# Patient Record
Sex: Female | Born: 1955 | Race: White | Hispanic: No | Smoking: Never smoker
Health system: Southern US, Community
[De-identification: ages and names within clinical notes are randomized; demographics above are authoritative.]

## PROBLEM LIST (undated history)

## (undated) DIAGNOSIS — E039 Hypothyroidism, unspecified: Secondary | ICD-10-CM

## (undated) DIAGNOSIS — E785 Hyperlipidemia, unspecified: Secondary | ICD-10-CM

## (undated) DIAGNOSIS — I10 Essential (primary) hypertension: Secondary | ICD-10-CM

## (undated) DIAGNOSIS — L409 Psoriasis, unspecified: Secondary | ICD-10-CM

## (undated) HISTORY — PX: ABDOMINAL HYSTERECTOMY: SHX81

## (undated) HISTORY — PX: TUBAL LIGATION: SHX77

---

## 2008-02-16 ENCOUNTER — Emergency Department: Payer: Self-pay | Admitting: Emergency Medicine

## 2008-03-12 ENCOUNTER — Ambulatory Visit: Payer: Self-pay

## 2015-04-06 ENCOUNTER — Other Ambulatory Visit: Payer: Self-pay | Admitting: Family Medicine

## 2015-04-06 DIAGNOSIS — Z1231 Encounter for screening mammogram for malignant neoplasm of breast: Secondary | ICD-10-CM

## 2015-04-08 ENCOUNTER — Ambulatory Visit
Admission: RE | Admit: 2015-04-08 | Discharge: 2015-04-08 | Disposition: A | Discharge status: Home / Self Care | Payer: BLUE CROSS/BLUE SHIELD | Source: Ambulatory Visit | Attending: Family Medicine | Admitting: Family Medicine

## 2015-04-08 DIAGNOSIS — Z1231 Encounter for screening mammogram for malignant neoplasm of breast: Secondary | ICD-10-CM | POA: Diagnosis present

## 2015-04-08 DIAGNOSIS — R922 Inconclusive mammogram: Secondary | ICD-10-CM | POA: Diagnosis not present

## 2015-04-10 ENCOUNTER — Other Ambulatory Visit: Payer: Self-pay | Admitting: Family Medicine

## 2015-04-10 DIAGNOSIS — R921 Mammographic calcification found on diagnostic imaging of breast: Secondary | ICD-10-CM

## 2015-04-10 DIAGNOSIS — R928 Other abnormal and inconclusive findings on diagnostic imaging of breast: Secondary | ICD-10-CM

## 2015-04-17 ENCOUNTER — Other Ambulatory Visit: Payer: Self-pay | Admitting: Family Medicine

## 2015-04-17 ENCOUNTER — Ambulatory Visit: Payer: BLUE CROSS/BLUE SHIELD

## 2015-04-17 ENCOUNTER — Ambulatory Visit
Admission: RE | Admit: 2015-04-17 | Discharge: 2015-04-17 | Disposition: A | Discharge status: Home / Self Care | Payer: BLUE CROSS/BLUE SHIELD | Source: Ambulatory Visit | Attending: Family Medicine | Admitting: Family Medicine

## 2015-04-17 DIAGNOSIS — R921 Mammographic calcification found on diagnostic imaging of breast: Secondary | ICD-10-CM | POA: Insufficient documentation

## 2015-04-17 DIAGNOSIS — R928 Other abnormal and inconclusive findings on diagnostic imaging of breast: Secondary | ICD-10-CM | POA: Diagnosis present

## 2015-07-24 ENCOUNTER — Encounter: Payer: Self-pay | Admitting: *Deleted

## 2015-07-27 ENCOUNTER — Encounter
Admission: RE | Disposition: A | Discharge status: Home Health | Payer: Self-pay | Source: Ambulatory Visit | Attending: Gastroenterology

## 2015-07-27 ENCOUNTER — Other Ambulatory Visit: Payer: Self-pay | Admitting: Gastroenterology

## 2015-07-27 ENCOUNTER — Ambulatory Visit
Admission: RE | Admit: 2015-07-27 | Discharge: 2015-07-27 | Disposition: A | Discharge status: Home Health | Payer: BLUE CROSS/BLUE SHIELD | Source: Ambulatory Visit | Attending: Gastroenterology | Admitting: Gastroenterology

## 2015-07-27 ENCOUNTER — Ambulatory Visit: Payer: BLUE CROSS/BLUE SHIELD | Admitting: Anesthesiology

## 2015-07-27 ENCOUNTER — Encounter: Payer: Self-pay | Admitting: *Deleted

## 2015-07-27 DIAGNOSIS — D123 Benign neoplasm of transverse colon: Secondary | ICD-10-CM | POA: Insufficient documentation

## 2015-07-27 DIAGNOSIS — E039 Hypothyroidism, unspecified: Secondary | ICD-10-CM | POA: Diagnosis not present

## 2015-07-27 DIAGNOSIS — L409 Psoriasis, unspecified: Secondary | ICD-10-CM | POA: Insufficient documentation

## 2015-07-27 DIAGNOSIS — K640 First degree hemorrhoids: Secondary | ICD-10-CM | POA: Insufficient documentation

## 2015-07-27 DIAGNOSIS — Z1211 Encounter for screening for malignant neoplasm of colon: Secondary | ICD-10-CM | POA: Insufficient documentation

## 2015-07-27 DIAGNOSIS — Z6841 Body Mass Index (BMI) 40.0 and over, adult: Secondary | ICD-10-CM | POA: Insufficient documentation

## 2015-07-27 DIAGNOSIS — R933 Abnormal findings on diagnostic imaging of other parts of digestive tract: Secondary | ICD-10-CM

## 2015-07-27 DIAGNOSIS — Z90710 Acquired absence of both cervix and uterus: Secondary | ICD-10-CM | POA: Insufficient documentation

## 2015-07-27 DIAGNOSIS — Z79899 Other long term (current) drug therapy: Secondary | ICD-10-CM | POA: Diagnosis not present

## 2015-07-27 DIAGNOSIS — I10 Essential (primary) hypertension: Secondary | ICD-10-CM | POA: Diagnosis not present

## 2015-07-27 DIAGNOSIS — K5730 Diverticulosis of large intestine without perforation or abscess without bleeding: Secondary | ICD-10-CM | POA: Diagnosis not present

## 2015-07-27 DIAGNOSIS — E785 Hyperlipidemia, unspecified: Secondary | ICD-10-CM | POA: Insufficient documentation

## 2015-07-27 HISTORY — DX: Essential (primary) hypertension: I10

## 2015-07-27 HISTORY — PX: COLONOSCOPY WITH PROPOFOL: SHX5780

## 2015-07-27 HISTORY — DX: Hypothyroidism, unspecified: E03.9

## 2015-07-27 HISTORY — DX: Hyperlipidemia, unspecified: E78.5

## 2015-07-27 HISTORY — DX: Psoriasis, unspecified: L40.9

## 2015-07-27 SURGERY — COLONOSCOPY WITH PROPOFOL
Anesthesia: General

## 2015-07-27 MED ORDER — MIDAZOLAM HCL 2 MG/2ML IJ SOLN
INTRAMUSCULAR | Status: DC | PRN
  Administered 2015-07-27: 1 mg via INTRAVENOUS

## 2015-07-27 MED ORDER — PROPOFOL 500 MG/50ML IV EMUL
INTRAVENOUS | Status: DC | PRN
  Administered 2015-07-27: 125 ug/kg/min via INTRAVENOUS

## 2015-07-27 MED ORDER — SODIUM CHLORIDE 0.9 % IV SOLN
INTRAVENOUS | Status: DC
  Administered 2015-07-27: 1000 mL via INTRAVENOUS

## 2015-07-27 MED ORDER — PROPOFOL 10 MG/ML IV BOLUS
INTRAVENOUS | Status: DC | PRN
  Administered 2015-07-27: 20 mg via INTRAVENOUS

## 2015-07-27 MED ORDER — GLYCOPYRROLATE 0.2 MG/ML IJ SOLN
INTRAMUSCULAR | Status: DC | PRN
  Administered 2015-07-27: 0.2 mg via INTRAVENOUS

## 2015-07-27 MED ORDER — FENTANYL CITRATE (PF) 100 MCG/2ML IJ SOLN
INTRAMUSCULAR | Status: DC | PRN
  Administered 2015-07-27 (×2): 25 ug via INTRAVENOUS
  Administered 2015-07-27: 50 ug via INTRAVENOUS

## 2015-07-27 NOTE — Transfer of Care (Signed)
2Immediate Anesthesia Transfer of Care Note  Patient: Krista Bishop  Procedure(s) Performed: Procedure(s): COLONOSCOPY WITH PROPOFOL (N/A)  Patient Location: PACU  Anesthesia Type:General  Level of Consciousness: awake, alert  and oriented  Airway & Oxygen Therapy: Patient Spontanous Breathing and Patient connected to nasal cannula oxygen  Post-op Assessment: Report given to RN and Post -op Vital signs reviewed and stable  Post vital signs: Reviewed and stable  Last Vitals:  Filed Vitals:   07/27/15 1047  BP: 133/91  Pulse:   Temp:   Resp:     Complications: No apparent anesthesia complications

## 2015-07-27 NOTE — Op Note (Addendum)
[facility] Gastroenterology Patient Name: Krista Bishop Procedure Date: 07/27/2015 11:39 AM MRN: [id number] Account #: [id number] Date of Birth: [birth year] Admit Type: Outpatient Age: 59 Room: [facility] ENDO ROOM 3 Gender: Female Note Status: Finalized Procedure:         Colonoscopy Indications:       Screening for colorectal malignant neoplasm, Last                     colonoscopy 10 years ago Patient Profile:   This is a 59 year old female. Providers:         Gerrit Heck. Rayann Heman, MD Referring MD:      Shirline Frees (Referring MD) Medicines:         Propofol per Anesthesia Complications:     No immediate complications. Procedure:         Pre-Anesthesia Assessment:                    - Prior to the procedure, a History and Physical was                     performed, and patient medications, allergies and                     sensitivities were reviewed. The patient's tolerance of                     previous anesthesia was reviewed.                    After obtaining informed consent, the colonoscope was                     passed under direct vision. Throughout the procedure, the                     patient's blood pressure, pulse, and oxygen saturations                     were monitored continuously. The Colonoscope was                     introduced through the anus and advanced to the the cecum,                     identified by appendiceal orifice and ileocecal valve. The                     colonoscopy was performed without difficulty. The patient                     tolerated the procedure well. The quality of the bowel                     preparation was excellent. Findings:      The perianal exam findings include non-thrombosed external hemorrhoids.      Internal hemorrhoids were found during retroflexion. The hemorrhoids       were Grade I (internal hemorrhoids that do not prolapse).      Two sessile polyps were found in the transverse colon. The polyps  were 3       mm in size. These polyps were removed with a jumbo cold forceps.       Resection and retrieval were complete.      A few small  and large-mouthed diverticula were found in the sigmoid       colon.      - Colon was pulsatile throughout.      The exam was otherwise without abnormality. Impression:        - Non-thrombosed external hemorrhoids found on perianal                     exam.                    - Internal hemorrhoids.                    - Two 3 mm polyps in the transverse colon. Resected and                     retrieved.                    - Diverticulosis in the sigmoid colon.                    - Colon was pulsatile throughout.                    - The examination was otherwise normal. Recommendation:    - Observe patient in GI recovery unit.                    - High fiber diet.                    - Continue present medications.                    - Await pathology results.                    - Repeat colonoscopy for surveillance based on pathology                     results.                    - Return to referring physician.                    - Obtain ultrasound of abdminal aorta to r/o aneurysm                     given pulsatile colon throughout.                    - The findings and recommendations were discussed with the                     patient.                    - The findings and recommendations were discussed with the                     patient's family. Procedure Code(s): --- Professional ---                    [phone], Colonoscopy, flexible; with biopsy, single or                     multiple CPT copyright 2014 [employer]. All rights reserved. The codes documented in this report are preliminary and upon coder review may  be revised to  meet current compliance requirements. Mellody Life, MD 07/27/2015 12:15:36 PM This report has been signed electronically. Number of Addenda: 0 Note Initiated On: 07/27/2015 11:39 AM Scope  Withdrawal Time: 0 hours 15 minutes 18 seconds  Total Procedure Duration: 0 hours 25 minutes 7 seconds       [facility]

## 2015-07-27 NOTE — Anesthesia Postprocedure Evaluation (Signed)
  Anesthesia Post-op Note  Patient: Krista Bishop  Procedure(s) Performed: Procedure(s): COLONOSCOPY WITH PROPOFOL (N/A)  Anesthesia type:General  Patient location: PACU  Post pain: Pain level controlled  Post assessment: Post-op Vital signs reviewed, Patient's Cardiovascular Status Stable, Respiratory Function Stable, Patent Airway and No signs of Nausea or vomiting  Post vital signs: Reviewed and stable  Last Vitals:  Filed Vitals:   07/27/15 1249  BP: 111/73  Pulse: 54  Temp:   Resp: 19    Level of consciousness: awake, alert  and patient cooperative  Complications: No apparent anesthesia complications

## 2015-07-27 NOTE — H&P (Signed)
  Primary Care Physician:  Sherrin Daisy, MD  Pre-Procedure History & Physical: HPI:  Krista Bishop is a 59 y.o. female is here for an colonoscopy.   Past Medical History  Diagnosis Date  . Hypertension   . Hypothyroidism   . Hyperlipidemia   . Psoriasis     Past Surgical History  Procedure Laterality Date  . Abdominal hysterectomy    . Tubal ligation      Prior to Admission medications   Medication Sig Start Date End Date Taking? Authorizing Provider  amLODipine (NORVASC) 5 MG tablet Take 5 mg by mouth daily.   Yes Historical Provider, MD  benazepril (LOTENSIN) 20 MG tablet Take 20 mg by mouth daily.   Yes Historical Provider, MD  levothyroxine (SYNTHROID, LEVOTHROID) 125 MCG tablet Take 125 mcg by mouth daily before breakfast.   Yes Historical Provider, MD    Allergies as of 06/16/2015  . (Not on File)    History reviewed. No pertinent family history.  Social History   Social History  . Marital Status: Married    Spouse Name: N/A  . Number of Children: N/A  . Years of Education: N/A   Occupational History  . Not on file.   Social History Main Topics  . Smoking status: Never Smoker   . Smokeless tobacco: Never Used  . Alcohol Use: No  . Drug Use: No  . Sexual Activity: Not on file   Other Topics Concern  . Not on file   Social History Narrative     Physical Exam: BP 133/91 mmHg  Pulse 72  Temp(Src) 98.5 F (36.9 C) (Tympanic)  Resp 19  Ht 5\' 2"  (1.575 m)  Wt 99.791 kg (220 lb)  BMI 40.23 kg/m2  SpO2 99% General:   Alert,  pleasant and cooperative in NAD Head:  Normocephalic and atraumatic. Neck:  Supple; no masses or thyromegaly. Lungs:  Clear throughout to auscultation.    Heart:  Regular rate and rhythm. Abdomen:  Soft, nontender and nondistended. Normal bowel sounds, without guarding, and without rebound.   Neurologic:  Alert and  oriented x4;  grossly normal neurologically.  Impression/Plan: Krista Bishop is here for an  colonoscopy to be performed for screening  Risks, benefits, limitations, and alternatives regarding  colonoscopy have been reviewed with the patient.  Questions have been answered.  All parties agreeable.   Josefine Class, MD  07/27/2015, 11:35 AM

## 2015-07-27 NOTE — Anesthesia Preprocedure Evaluation (Signed)
Anesthesia Evaluation  Patient identified by MRN, date of birth, ID band Patient awake    Reviewed: Allergy & Precautions, H&P , NPO status , Patient's Chart, lab work & pertinent test results  History of Anesthesia Complications Negative for: history of anesthetic complications  Airway Mallampati: III  TM Distance: >3 FB Neck ROM: limited    Dental  (+) Teeth Intact   Pulmonary neg pulmonary ROS,    Pulmonary exam normal breath sounds clear to auscultation       Cardiovascular Exercise Tolerance: Good hypertension, (-) angina(-) Past MI Normal cardiovascular exam Rhythm:regular Rate:Normal     Neuro/Psych negative neurological ROS  negative psych ROS   GI/Hepatic negative GI ROS, Neg liver ROS, neg GERD  ,  Endo/Other  Hypothyroidism Morbid obesity  Renal/GU negative Renal ROS  negative genitourinary   Musculoskeletal   Abdominal   Peds  Hematology negative hematology ROS (+)   Anesthesia Other Findings Past Medical History:   Hypertension                                                 Hypothyroidism                                               Hyperlipidemia                                               Psoriasis                                                   BMI    Body Mass Index   40.22 kg/m 2      Reproductive/Obstetrics negative OB ROS                             Anesthesia Physical Anesthesia Plan  ASA: III  Anesthesia Plan: General   Post-op Pain Management:    Induction:   Airway Management Planned:   Additional Equipment:   Intra-op Plan:   Post-operative Plan:   Informed Consent: I have reviewed the patients History and Physical, chart, labs and discussed the procedure including the risks, benefits and alternatives for the proposed anesthesia with the patient or authorized representative who has indicated his/her understanding and acceptance.    Dental Advisory Given  Plan Discussed with: Anesthesiologist, CRNA and Surgeon  Anesthesia Plan Comments:         Anesthesia Quick Evaluation

## 2015-07-28 LAB — SURGICAL PATHOLOGY

## 2015-08-03 ENCOUNTER — Ambulatory Visit
Admission: RE | Admit: 2015-08-03 | Discharge: 2015-08-03 | Disposition: A | Discharge status: Home / Self Care | Payer: BLUE CROSS/BLUE SHIELD | Source: Ambulatory Visit | Attending: Gastroenterology | Admitting: Gastroenterology

## 2015-08-03 DIAGNOSIS — N281 Cyst of kidney, acquired: Secondary | ICD-10-CM | POA: Diagnosis not present

## 2015-08-03 DIAGNOSIS — R933 Abnormal findings on diagnostic imaging of other parts of digestive tract: Secondary | ICD-10-CM

## 2015-08-05 ENCOUNTER — Encounter: Payer: Self-pay | Admitting: Gastroenterology

## 2016-05-27 ENCOUNTER — Other Ambulatory Visit: Payer: Self-pay | Admitting: Family Medicine

## 2016-05-27 DIAGNOSIS — Z1231 Encounter for screening mammogram for malignant neoplasm of breast: Secondary | ICD-10-CM

## 2016-05-30 ENCOUNTER — Ambulatory Visit
Admission: RE | Admit: 2016-05-30 | Discharge: 2016-05-30 | Disposition: A | Discharge status: Home / Self Care | Payer: BLUE CROSS/BLUE SHIELD | Source: Ambulatory Visit | Attending: Family Medicine | Admitting: Family Medicine

## 2016-05-30 ENCOUNTER — Other Ambulatory Visit: Payer: Self-pay | Admitting: Family Medicine

## 2016-05-30 DIAGNOSIS — Z1231 Encounter for screening mammogram for malignant neoplasm of breast: Secondary | ICD-10-CM | POA: Insufficient documentation

## 2017-04-17 ENCOUNTER — Other Ambulatory Visit: Payer: Self-pay | Admitting: Family Medicine

## 2017-04-17 DIAGNOSIS — Z1231 Encounter for screening mammogram for malignant neoplasm of breast: Secondary | ICD-10-CM

## 2017-05-09 ENCOUNTER — Other Ambulatory Visit: Payer: Self-pay | Admitting: Family Medicine

## 2017-05-09 DIAGNOSIS — Z1231 Encounter for screening mammogram for malignant neoplasm of breast: Secondary | ICD-10-CM

## 2017-06-05 ENCOUNTER — Ambulatory Visit
Admission: RE | Admit: 2017-06-05 | Discharge: 2017-06-05 | Disposition: A | Discharge status: Home / Self Care | Payer: BLUE CROSS/BLUE SHIELD | Source: Ambulatory Visit | Attending: Family Medicine | Admitting: Family Medicine

## 2017-06-05 DIAGNOSIS — Z1231 Encounter for screening mammogram for malignant neoplasm of breast: Secondary | ICD-10-CM | POA: Insufficient documentation

## 2018-07-31 ENCOUNTER — Other Ambulatory Visit: Payer: Self-pay | Admitting: Internal Medicine

## 2018-07-31 DIAGNOSIS — Z1231 Encounter for screening mammogram for malignant neoplasm of breast: Secondary | ICD-10-CM

## 2018-08-07 ENCOUNTER — Ambulatory Visit
Admission: RE | Admit: 2018-08-07 | Discharge: 2018-08-07 | Disposition: A | Discharge status: Home / Self Care | Payer: BLUE CROSS/BLUE SHIELD | Source: Ambulatory Visit | Attending: Internal Medicine | Admitting: Internal Medicine

## 2018-08-07 DIAGNOSIS — Z1231 Encounter for screening mammogram for malignant neoplasm of breast: Secondary | ICD-10-CM | POA: Insufficient documentation

## 2019-07-04 ENCOUNTER — Other Ambulatory Visit: Payer: Self-pay | Admitting: Student

## 2019-07-04 DIAGNOSIS — Z1231 Encounter for screening mammogram for malignant neoplasm of breast: Secondary | ICD-10-CM

## 2019-08-12 ENCOUNTER — Encounter (INDEPENDENT_AMBULATORY_CARE_PROVIDER_SITE_OTHER): Payer: Self-pay

## 2019-08-12 ENCOUNTER — Other Ambulatory Visit: Payer: Self-pay

## 2019-08-12 ENCOUNTER — Ambulatory Visit
Admission: RE | Admit: 2019-08-12 | Discharge: 2019-08-12 | Disposition: A | Discharge status: Home / Self Care | Payer: BC Managed Care – PPO | Source: Ambulatory Visit | Attending: Family Medicine | Admitting: Family Medicine

## 2019-08-12 DIAGNOSIS — Z1231 Encounter for screening mammogram for malignant neoplasm of breast: Secondary | ICD-10-CM | POA: Insufficient documentation

## 2020-07-20 ENCOUNTER — Other Ambulatory Visit: Payer: Self-pay | Admitting: Student

## 2020-07-20 ENCOUNTER — Other Ambulatory Visit: Payer: Self-pay | Admitting: Family Medicine

## 2020-07-20 DIAGNOSIS — Z1231 Encounter for screening mammogram for malignant neoplasm of breast: Secondary | ICD-10-CM

## 2020-07-27 ENCOUNTER — Other Ambulatory Visit: Payer: Self-pay | Admitting: Student

## 2020-07-27 DIAGNOSIS — Z780 Asymptomatic menopausal state: Secondary | ICD-10-CM

## 2020-08-19 ENCOUNTER — Ambulatory Visit
Admission: RE | Admit: 2020-08-19 | Discharge: 2020-08-19 | Disposition: A | Discharge status: Home / Self Care | Payer: Medicare Other | Source: Ambulatory Visit | Attending: Student | Admitting: Student

## 2020-08-19 ENCOUNTER — Other Ambulatory Visit: Payer: Self-pay

## 2020-08-19 DIAGNOSIS — Z13820 Encounter for screening for osteoporosis: Secondary | ICD-10-CM | POA: Diagnosis not present

## 2020-08-19 DIAGNOSIS — M8580 Other specified disorders of bone density and structure, unspecified site: Secondary | ICD-10-CM | POA: Insufficient documentation

## 2020-08-19 DIAGNOSIS — Z1231 Encounter for screening mammogram for malignant neoplasm of breast: Secondary | ICD-10-CM | POA: Insufficient documentation

## 2020-08-19 DIAGNOSIS — Z780 Asymptomatic menopausal state: Secondary | ICD-10-CM | POA: Insufficient documentation

## 2020-08-26 ENCOUNTER — Other Ambulatory Visit: Payer: Self-pay | Admitting: Student

## 2020-08-26 DIAGNOSIS — R928 Other abnormal and inconclusive findings on diagnostic imaging of breast: Secondary | ICD-10-CM

## 2020-09-04 ENCOUNTER — Ambulatory Visit
Admission: RE | Admit: 2020-09-04 | Discharge: 2020-09-04 | Disposition: A | Discharge status: Home / Self Care | Payer: Medicare Other | Source: Ambulatory Visit | Attending: Student | Admitting: Student

## 2020-09-04 ENCOUNTER — Other Ambulatory Visit: Payer: Self-pay

## 2020-09-04 DIAGNOSIS — R928 Other abnormal and inconclusive findings on diagnostic imaging of breast: Secondary | ICD-10-CM

## 2022-05-28 IMAGING — MG DIGITAL SCREENING BILAT W/ TOMO W/ CAD
8 series · 8 of 24 positions shown · non-contrast
Comparison: Previous exam(s).

ACR Breast Density Category a: The breast tissue is almost entirely
fatty.

CLINICAL DATA: Screening.

EXAM:
DIGITAL SCREENING BILATERAL MAMMOGRAM WITH TOMO AND CAD

[L CC synth-2D]
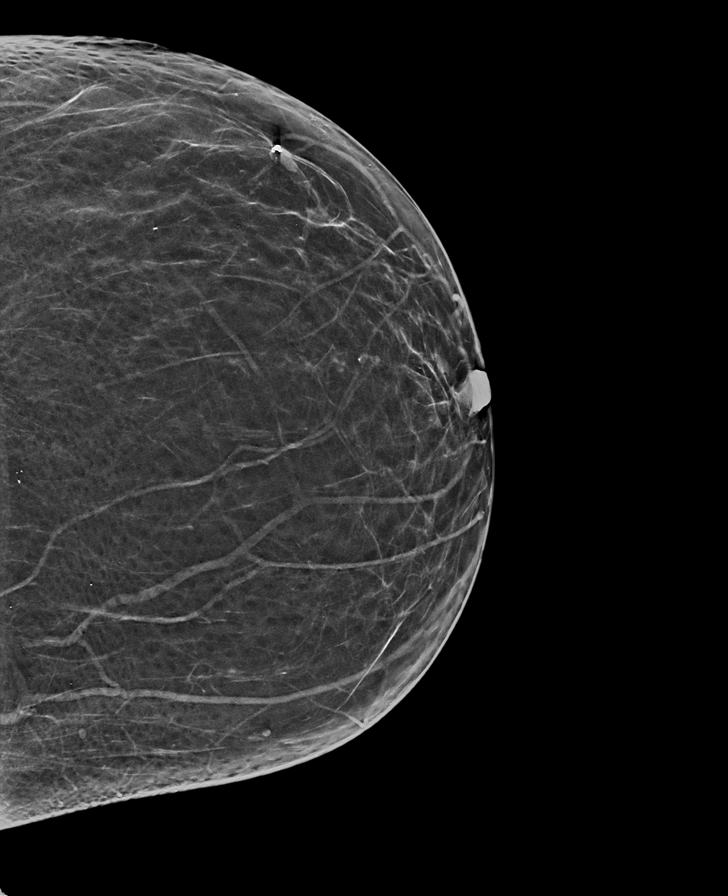

[L MLO synth-2D]
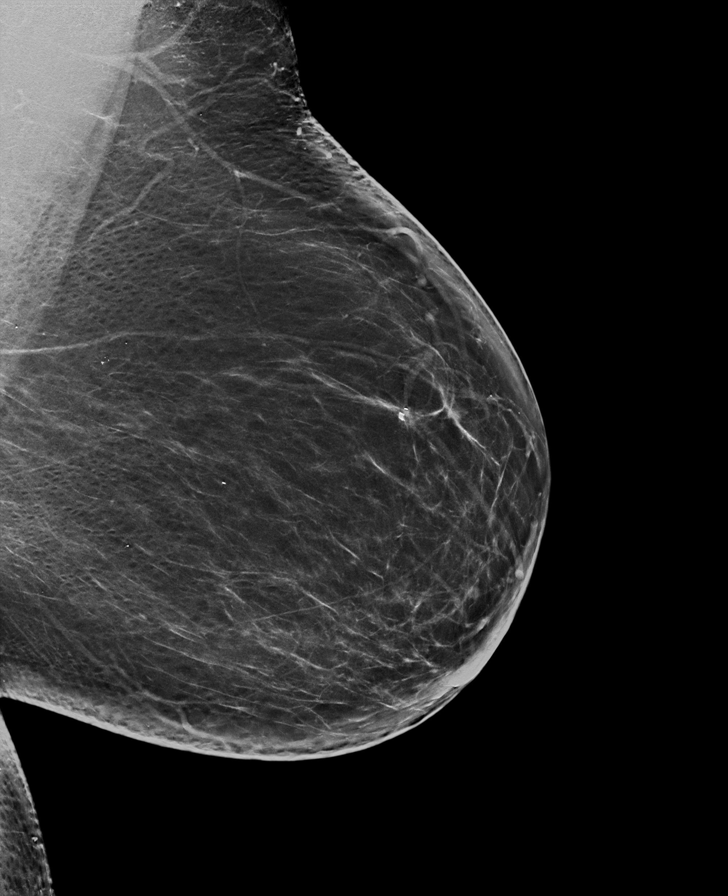

[R CC synth-2D]
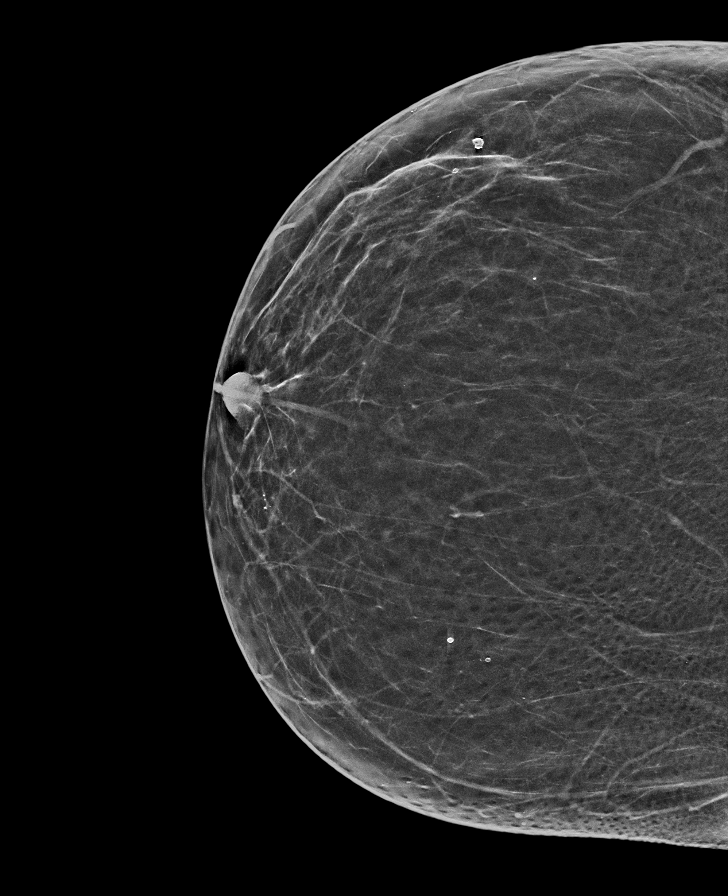

[R MLO synth-2D]
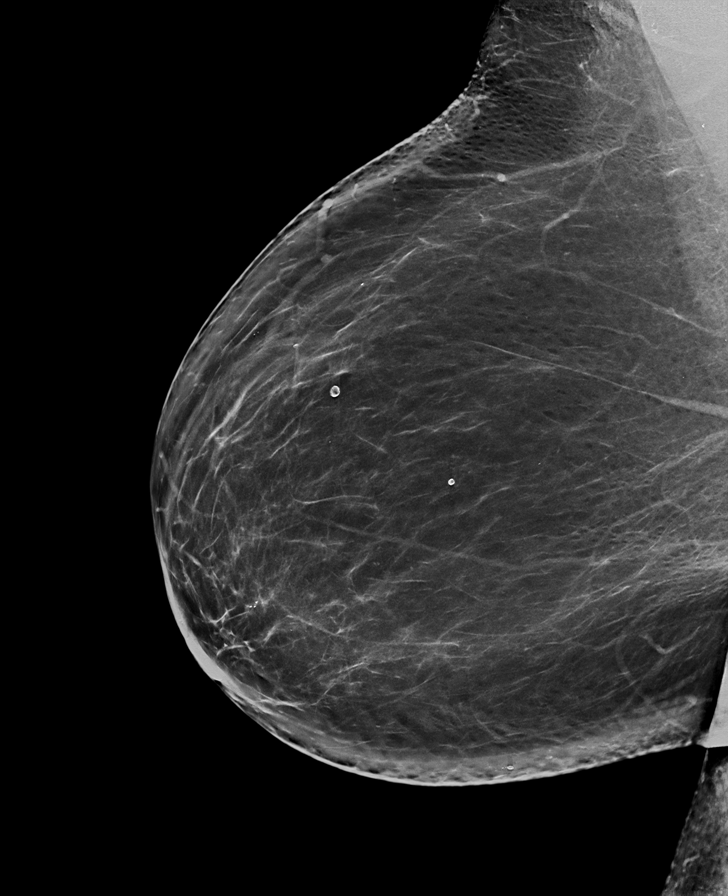

[R CC tomo · tomo slice 33/64.0]
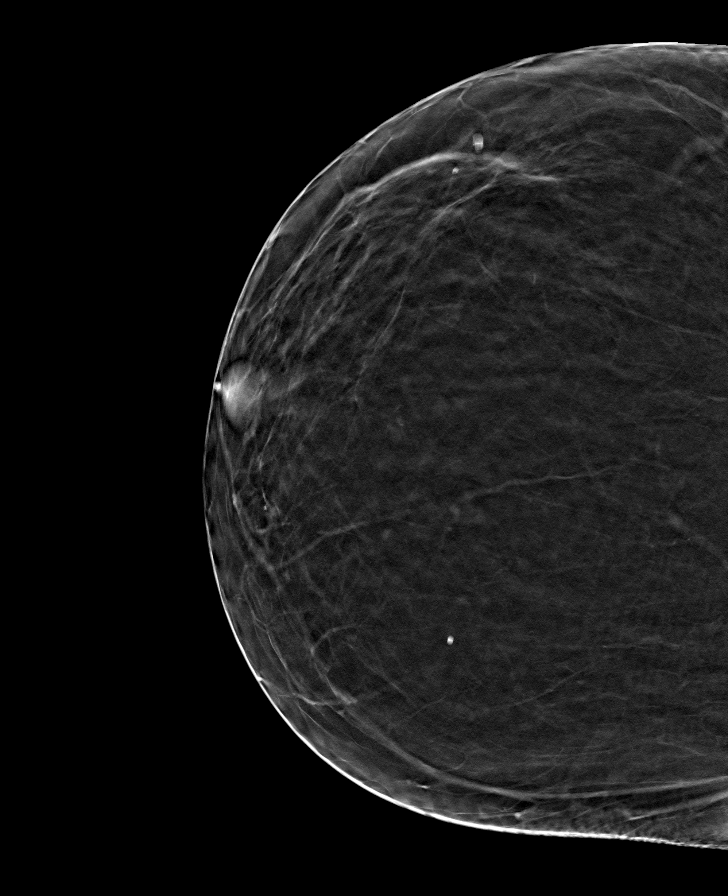

[L MLO tomo · tomo slice 45/90.0]
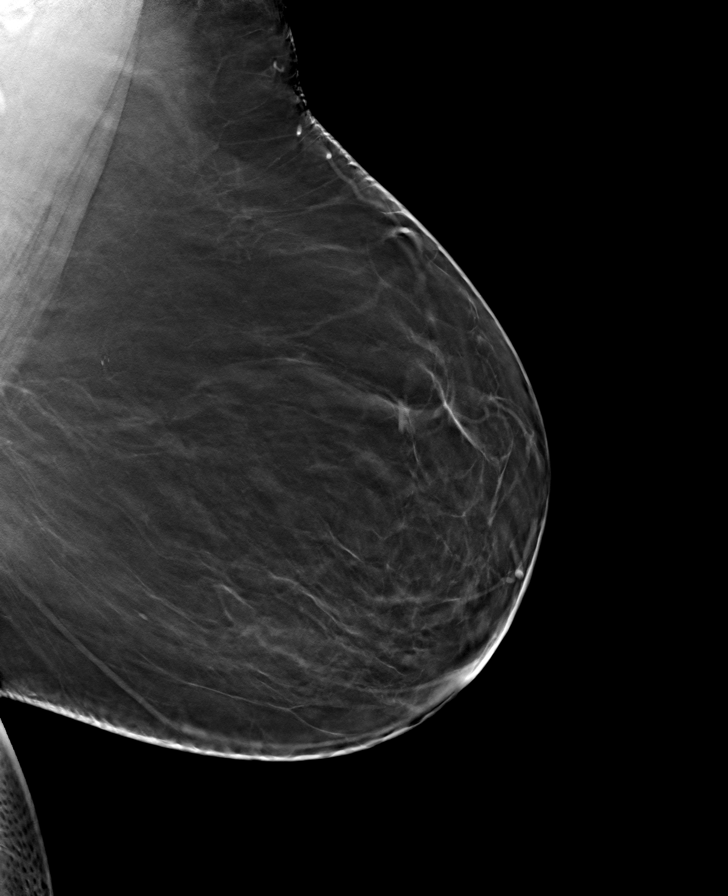

[L CC tomo · tomo slice 33/66.0]
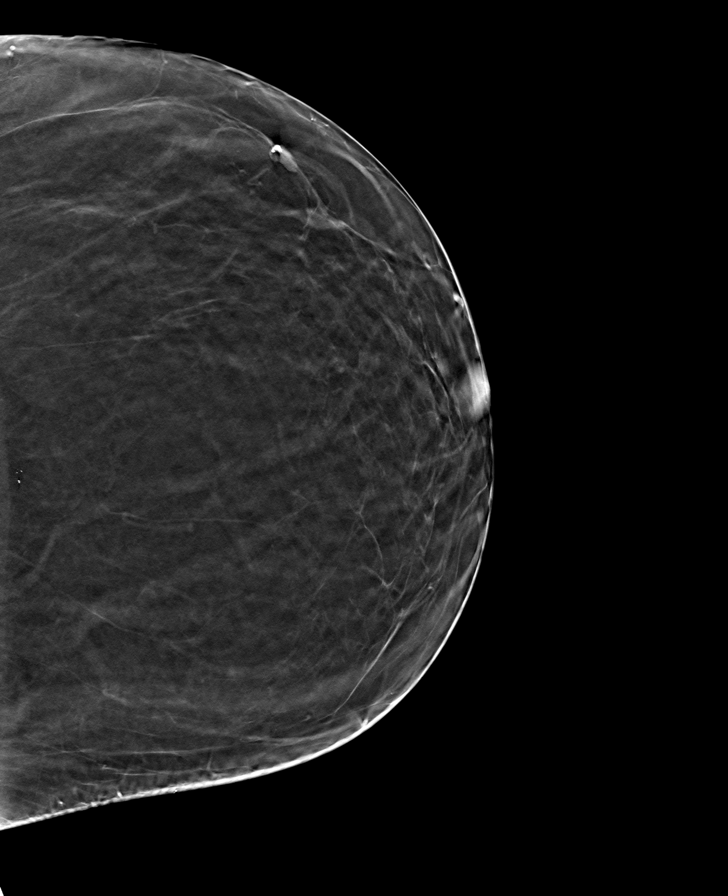

[R MLO tomo · tomo slice 46/91.0]
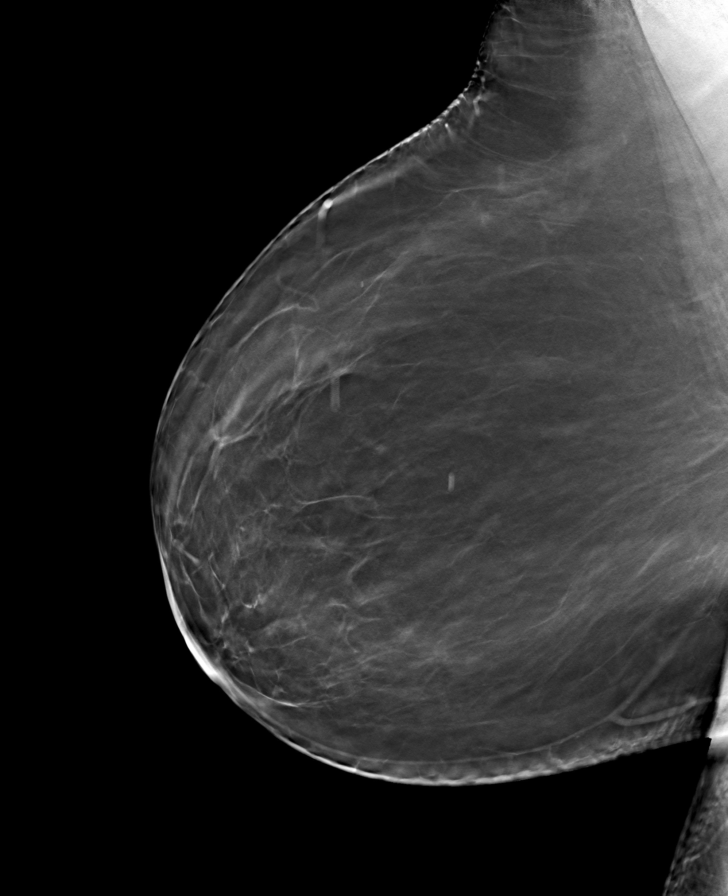

[8 of 24 positions shown; findings below may reference images not displayed]

FINDINGS: In the left breast, possible distortion warrants further evaluation.
In the right breast, no findings suspicious for malignancy. Images
were processed with CAD.
IMPRESSION: Further evaluation is suggested for possible distortion in the left
breast.

RECOMMENDATION:
Diagnostic mammogram and possibly ultrasound of the left breast.
(Code:XN-7-XXC)

The patient will be contacted regarding the findings, and additional
imaging will be scheduled.

BI-RADS CATEGORY  0: Incomplete. Need additional imaging evaluation
and/or prior mammograms for comparison.
